# Patient Record
Sex: Male | Born: 1970 | Race: White | Hispanic: No | Smoking: Never smoker
Health system: Southern US, Community
[De-identification: ages and names within clinical notes are randomized; demographics above are authoritative.]

## PROBLEM LIST (undated history)

## (undated) HISTORY — PX: EYE SURGERY: SHX253

---

## 2007-02-27 ENCOUNTER — Emergency Department: Payer: Self-pay | Admitting: Emergency Medicine

## 2007-03-03 ENCOUNTER — Ambulatory Visit: Payer: Self-pay | Admitting: Ophthalmology
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
## 2011-06-15 ENCOUNTER — Ambulatory Visit: Payer: Self-pay | Admitting: Internal Medicine
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
## 2015-03-31 ENCOUNTER — Emergency Department
Admission: EM | Admit: 2015-03-31 | Discharge: 2015-03-31 | Disposition: A | Discharge status: Home / Self Care | Payer: 59 | Attending: Emergency Medicine | Admitting: Emergency Medicine

## 2015-03-31 ENCOUNTER — Emergency Department: Payer: 59

## 2015-03-31 ENCOUNTER — Encounter: Payer: Self-pay | Admitting: *Deleted

## 2015-03-31 DIAGNOSIS — S8392XA Sprain of unspecified site of left knee, initial encounter: Secondary | ICD-10-CM | POA: Insufficient documentation

## 2015-03-31 DIAGNOSIS — S86812A Strain of other muscle(s) and tendon(s) at lower leg level, left leg, initial encounter: Secondary | ICD-10-CM | POA: Diagnosis not present

## 2015-03-31 DIAGNOSIS — S8992XA Unspecified injury of left lower leg, initial encounter: Secondary | ICD-10-CM | POA: Diagnosis present

## 2015-03-31 DIAGNOSIS — Y998 Other external cause status: Secondary | ICD-10-CM | POA: Diagnosis not present

## 2015-03-31 DIAGNOSIS — Y9389 Activity, other specified: Secondary | ICD-10-CM | POA: Insufficient documentation

## 2015-03-31 DIAGNOSIS — Y9289 Other specified places as the place of occurrence of the external cause: Secondary | ICD-10-CM | POA: Insufficient documentation

## 2015-03-31 MED ORDER — OXYCODONE-ACETAMINOPHEN 5-325 MG PO TABS: 1.0000 | ORAL_TABLET | ORAL | Status: DC | PRN
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
## 2015-03-31 NOTE — Discharge Instructions (Signed)
Cryotherapy Cryotherapy is when you put ice on your injury. Ice helps lessen pain and puffiness (swelling) after an injury. Ice works the best when you start using it in the first 24 to 48 hours after an injury. HOME CARE  Put a dry or damp towel between the ice pack and your skin.  You may press gently on the ice pack.  Leave the ice on for no more than 10 to 20 minutes at a time.  Check your skin after 5 minutes to make sure your skin is okay.  Rest at least 20 minutes between ice pack uses.  Stop using ice when your skin loses feeling (numbness).  Do not use ice on someone who cannot tell you when it hurts. This includes small children and people with memory problems (dementia). GET HELP RIGHT AWAY IF:  You have white spots on your skin.  Your skin turns blue or pale.  Your skin feels waxy or hard.  Your puffiness gets worse. MAKE SURE YOU:   Understand these instructions.  Will watch your condition.  Will get help right away if you are not doing well or get worse.   This information is not intended to replace advice given to you by your health care provider. Make sure you discuss any questions you have with your health care provider.   Document Released: 11/04/2007 Document Revised: 08/10/2011 Document Reviewed: 01/08/2011 Elsevier Interactive Patient Education 2016 [employer]    [facility]. Use crutches and knee immobilizer until seen. Ice and elevate for swelling. Prednisone as needed for pain as directed Plain x-rays showed no bony abnormalities however soft tissue injuries do not show up on this type of x-ray. Further imaging may be required if not improving.
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
## 2020-04-30 ENCOUNTER — Ambulatory Visit: Admit: 2020-04-30 | Discharge status: Absent without leave | Payer: 59

## 2022-02-20 ENCOUNTER — Ambulatory Visit (INDEPENDENT_AMBULATORY_CARE_PROVIDER_SITE_OTHER): Payer: 59

## 2022-02-20 ENCOUNTER — Encounter: Payer: Self-pay | Admitting: Emergency Medicine

## 2022-02-20 ENCOUNTER — Ambulatory Visit
Admission: EM | Admit: 2022-02-20 | Discharge: 2022-02-20 | Disposition: A | Discharge status: Home / Self Care | Payer: 59 | Attending: Emergency Medicine | Admitting: Emergency Medicine

## 2022-02-20 DIAGNOSIS — M25532 Pain in left wrist: Secondary | ICD-10-CM

## 2022-02-20 DIAGNOSIS — S63502A Unspecified sprain of left wrist, initial encounter: Secondary | ICD-10-CM | POA: Diagnosis not present

## 2022-02-20 MED ORDER — IBUPROFEN 600 MG PO TABS: 600.0000 mg | ORAL_TABLET | Freq: Four times a day (QID) | ORAL | 0 refills | Status: AC | PRN
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
## 2022-02-20 NOTE — Discharge Instructions (Addendum)
Your x-rays did not show any evidence of fracture, dislocation, or arthritis.  I believe that you have sprained your wrist.  Sprains involve the soft tissues and do not show up on x-ray.  Continue wearing the splint you have as this will help keep your wrist in a neutral position and prevent further injury.  I recommend wearing this for the next week to 10 days.  Take ibuprofen every 6 hours with food as needed for pain and inflammation.  Apply moist heat to your wrist for 20 minutes at a time 2-3 times a day to help increase blood flow and aid in resolution of the injured tissue.  If you continue have pain, or the pain gets worse, I recommend following up with orthopedics, such as [facility] in [place].
